# Patient Record
Sex: Male | Born: 1980 | Race: White | Hispanic: No | Marital: Single | Smoking: Never smoker
Health system: Southern US, Community
[De-identification: ages and names within clinical notes are randomized; demographics above are authoritative.]

## PROBLEM LIST (undated history)

## (undated) DIAGNOSIS — G950 Syringomyelia and syringobulbia: Secondary | ICD-10-CM

## (undated) DIAGNOSIS — K219 Gastro-esophageal reflux disease without esophagitis: Secondary | ICD-10-CM

## (undated) DIAGNOSIS — I10 Essential (primary) hypertension: Secondary | ICD-10-CM

## (undated) DIAGNOSIS — Q0700 Arnold-Chiari syndrome without spina bifida or hydrocephalus: Secondary | ICD-10-CM

## (undated) DIAGNOSIS — G2581 Restless legs syndrome: Secondary | ICD-10-CM

## (undated) HISTORY — DX: Syringomyelia and syringobulbia: G95.0

## (undated) HISTORY — PX: BREAST SURGERY: SHX581

## (undated) HISTORY — PX: BRAIN SURGERY: SHX531

## (undated) HISTORY — DX: Essential (primary) hypertension: I10

## (undated) HISTORY — DX: Restless legs syndrome: G25.81

## (undated) HISTORY — DX: Gastro-esophageal reflux disease without esophagitis: K21.9

---

## 2014-11-15 ENCOUNTER — Emergency Department (HOSPITAL_COMMUNITY)
Admission: EM | Admit: 2014-11-15 | Discharge: 2014-11-15 | Disposition: A | Discharge status: Home / Self Care | Payer: Medicare Other | Attending: Emergency Medicine | Admitting: Emergency Medicine

## 2014-11-15 ENCOUNTER — Encounter (HOSPITAL_COMMUNITY): Payer: Self-pay | Admitting: Emergency Medicine

## 2014-11-15 DIAGNOSIS — B029 Zoster without complications: Secondary | ICD-10-CM

## 2014-11-15 DIAGNOSIS — Z79899 Other long term (current) drug therapy: Secondary | ICD-10-CM | POA: Insufficient documentation

## 2014-11-15 DIAGNOSIS — Z87728 Personal history of other specified (corrected) congenital malformations of nervous system and sense organs: Secondary | ICD-10-CM | POA: Diagnosis not present

## 2014-11-15 DIAGNOSIS — R21 Rash and other nonspecific skin eruption: Secondary | ICD-10-CM | POA: Diagnosis present

## 2014-11-15 HISTORY — DX: Arnold-Chiari syndrome without spina bifida or hydrocephalus: Q07.00

## 2014-11-15 MED ORDER — KETOROLAC TROMETHAMINE 30 MG/ML IJ SOLN
30.0000 mg | Freq: Once | INTRAMUSCULAR | Status: AC
  Administered 2014-11-15: 30 mg via INTRAMUSCULAR
  Filled 2014-11-15: qty 1

## 2014-11-15 MED ORDER — VALACYCLOVIR HCL 1 G PO TABS: 1000.0000 mg | ORAL_TABLET | Freq: Three times a day (TID) | ORAL | Status: AC

## 2014-11-15 NOTE — Discharge Instructions (Signed)

## 2014-11-15 NOTE — ED Provider Notes (Signed)
History  This chart was scribed for non-physician practitioner, Eyvonne Mechanic, PA-C,working with Elwin Mocha, MD, by Karle Plumber, ED Scribe. This patient was seen in room TR08C/TR08C and the patient's care was started at 9:46 AM.  Chief Complaint  Patient presents with  . Insect Bite   The history is provided by the patient and medical records. No language interpreter was used.    HPI Comments:  Jacob Ritter is a 34 y.o. male who presents to the Emergency Department complaining of a rash to the left upper arm that began approximately four days ago. He reports the area started bubbling up and began to cause him severe, burning pain. He reports associated redness. He has been applying witch hazel to the area to treat the symptoms with no significant relief. Touching the area or raising his left arm makes the pain worse. He denies alleviating factors. He denies fever, chills, nausea or vomiting. He reports having chicken pox as a child.  Past Medical History  Diagnosis Date  . ACM (Arnold-Chiari malformation)    Past Surgical History  Procedure Laterality Date  . Brain surgery     No family history on file. History  Substance Use Topics  . Smoking status: Never Smoker   . Smokeless tobacco: Not on file  . Alcohol Use: No    Review of Systems  All other systems reviewed and are negative.   Allergies  Seroquel  Home Medications   Prior to Admission medications   Medication Sig Start Date End Date Taking? Authorizing Provider  amLODipine (NORVASC) 10 MG tablet Take 10 mg by mouth daily.   Yes Historical Provider, MD  gabapentin (NEURONTIN) 100 MG capsule Take 1,600 mg by mouth 2 (two) times daily.   Yes Historical Provider, MD  hydrochlorothiazide (HYDRODIURIL) 12.5 MG tablet Take 12.5 mg by mouth daily.   Yes Historical Provider, MD  meloxicam (MOBIC) 15 MG tablet Take 15 mg by mouth daily.   Yes Historical Provider, MD  valACYclovir (VALTREX) 1000 MG tablet Take 1  tablet (1,000 mg total) by mouth 3 (three) times daily. 11/15/14 11/29/14  Eyvonne Mechanic, PA-C   Triage Vitals: BP 146/78 mmHg  Pulse 75  Temp(Src) 97.9 F (36.6 C) (Oral)  Resp 17  Ht  (1.905 m)  Wt 259 lb (117.482 kg)  BMI 32.37 kg/m2  SpO2 98% Physical Exam  Constitutional: He is oriented to person, place, and time. He appears well-developed and well-nourished.  HENT:  Head: Normocephalic and atraumatic.  Eyes: EOM are normal.  Neck: Normal range of motion.  Cardiovascular: Normal rate.   Pulmonary/Chest: Effort normal.  Musculoskeletal: Normal range of motion.  Neurological: He is alert and oriented to person, place, and time.  Skin: Skin is warm and dry. Rash noted. There is erythema.  Multiple erythematous, vesicular  lesions to left posterior shoulder and deltoid region. No signs of surrounding cellulitis.  Psychiatric: He has a normal mood and affect. His behavior is normal.  Nursing note and vitals reviewed.   ED Course  Procedures (including critical care time) DIAGNOSTIC STUDIES: Oxygen Saturation is 98% on RA, normal by my interpretation.   COORDINATION OF CARE: 9:54 AM- Will order Toradol injection for pain. Pt refuses prescription for pain medication. Will prescribe Valtrex. Pt verbalizes understanding and agrees to plan.  Medications  ketorolac (TORADOL) 30 MG/ML injection 30 mg (30 mg Intramuscular Given 11/15/14 1026)    Labs Review Labs Reviewed - No data to display  Imaging Review No results found.  EKG Interpretation None      MDM   Final diagnoses:  Shingles   Labs: n/a  Imaging: n/a  Consults: n/a  Therapeutics: Toradol 30 mg IM  Discharge Meds: Valacyclovir 1 gm PO TID x 7 days  Assessment/Plan: Will give Toradol 30 mg IM injection prior to discharge. Pt declined any pain medication. Will prescribe Valacyclovir 1 gm PO TID x 7 days. Follow-up with primary care provider in 3-5 days for reassessment. Strict return precautions  given, verbalizes understanding and agreement for today's plan.   I personally performed the services described in this documentation, which was scribed in my presence. The recorded information has been reviewed and is accurate.    Eyvonne Mechanic, PA-C 11/15/14 1535  Elwin Mocha, MD 11/15/14 1539

## 2014-11-15 NOTE — ED Notes (Signed)
Pt. Stated, I think I was bitten by a something 4 days ago on my left shoulder and now i can hardly lift it.

## 2014-11-15 NOTE — ED Notes (Signed)
J Hedges, PA, aware pt's HR 50 - no orders received - OK given to d/c pt.

## 2014-11-25 ENCOUNTER — Emergency Department (HOSPITAL_COMMUNITY)
Admission: EM | Admit: 2014-11-25 | Discharge: 2014-11-25 | Disposition: A | Discharge status: Home / Self Care | Payer: Medicare Other | Attending: Emergency Medicine | Admitting: Emergency Medicine

## 2014-11-25 ENCOUNTER — Encounter (HOSPITAL_COMMUNITY): Payer: Self-pay | Admitting: Physical Medicine and Rehabilitation

## 2014-11-25 DIAGNOSIS — L255 Unspecified contact dermatitis due to plants, except food: Secondary | ICD-10-CM | POA: Diagnosis not present

## 2014-11-25 DIAGNOSIS — R531 Weakness: Secondary | ICD-10-CM | POA: Insufficient documentation

## 2014-11-25 DIAGNOSIS — R21 Rash and other nonspecific skin eruption: Secondary | ICD-10-CM | POA: Diagnosis present

## 2014-11-25 DIAGNOSIS — Z79899 Other long term (current) drug therapy: Secondary | ICD-10-CM | POA: Diagnosis not present

## 2014-11-25 MED ORDER — SILVER NITRATE-POT NITRATE 75-25 % EX MISC
1.0000 "application " | Freq: Once | CUTANEOUS | Status: AC
  Administered 2014-11-25: 1 via TOPICAL
  Filled 2014-11-25: qty 1

## 2014-11-25 MED ORDER — PREDNISONE 10 MG PO TABS: 20.0000 mg | ORAL_TABLET | Freq: Every day | ORAL | Status: DC

## 2014-11-25 NOTE — ED Provider Notes (Signed)
CSN: [id number]     Arrival date & time 11/25/14  1133 History  This chart was scribed for non-physician practitioner, Kerrie Buffalo, NP working with Cathren Laine, MD by Gwenyth Ober, ED scribe. This patient was seen in room TR03C/TR03C and the patient's care was started at 1:35 PM   Chief Complaint  Patient presents with  . Herpes Zoster  . Rash   Patient is a 34 y.o. male presenting with rash. The history is provided by the patient. No language interpreter was used.  Rash Location:  Shoulder/arm and foot Shoulder/arm rash location:  L shoulder Foot rash location:  Top of R foot Quality: redness   Quality: not itchy   Severity:  Mild Onset quality:  Gradual Duration:  5 days Timing:  Constant Progression:  Unchanged Chronicity:  New Context: plant contact   Relieved by:  Nothing Worsened by:  Nothing tried Associated symptoms: no fever and no URI     HPI Comments: Jacob Ritter is a 34 y.o. male with a history of Arnold-Chiari malformation, who presents to the Emergency Department complaining of a mild red rash on his right foot, with associated mild throbbing pain, that started 5 days ago. He states a painful sore on the bottom his tongue as an associated symptom. Pt was seen in the ED on 7/30 for a rash on his left shoulder. He was diagnosed with shingles and prescribed Valtrex, which has improved the rash on his shoulder. Pt reports some baseline decreased sensation in his right foot, right leg and left arm which is unchanged today. He endorses walking outside with open-toed shoes and states that someone told him he was walking through poison ivy prior to the onset of symptoms. Pt denies cough, congestion, fever and chills as associated symptoms.  Past Medical History  Diagnosis Date  . ACM (Arnold-Chiari malformation)    Past Surgical History  Procedure Laterality Date  . Brain surgery     History reviewed. No pertinent family history. Social History  Substance Use Topics   . Smoking status: Never Smoker   . Smokeless tobacco: None  . Alcohol Use: No    Review of Systems  Constitutional: Negative for fever and chills.  HENT: Negative for congestion.   Respiratory: Negative for cough.   Skin: Positive for rash.  Neurological: Positive for weakness (that is not new).  All other systems reviewed and are negative.     Allergies  Seroquel  Home Medications   Prior to Admission medications   Medication Sig Start Date End Date Taking? Authorizing Provider  amLODipine (NORVASC) 10 MG tablet Take 10 mg by mouth daily.    Historical Provider, MD  gabapentin (NEURONTIN) 100 MG capsule Take 1,600 mg by mouth 2 (two) times daily.    Historical Provider, MD  hydrochlorothiazide (HYDRODIURIL) 12.5 MG tablet Take 12.5 mg by mouth daily.    Historical Provider, MD  meloxicam (MOBIC) 15 MG tablet Take 15 mg by mouth daily.    Historical Provider, MD  predniSONE (DELTASONE) 10 MG tablet Take 2 tablets (20 mg total) by mouth daily. 11/25/14   Hope Orlene Och, NP  valACYclovir (VALTREX) 1000 MG tablet Take 1 tablet (1,000 mg total) by mouth 3 (three) times daily. 11/15/14 11/29/14  Jeffrey Hedges, PA-C   BP 130/73 mmHg  Pulse 58  Temp(Src) 98.2 F (36.8 C) (Oral)  Resp 14  SpO2 99% Physical Exam  Constitutional: He is oriented to person, place, and time. He appears well-developed and well-nourished. No distress.  HENT:  Head: Normocephalic and atraumatic.  Mouth/Throat: Uvula is midline. No posterior oropharyngeal edema.  Ulcer area on the bottom of the tongue on the right side  Eyes: Conjunctivae and EOM are normal.  Neck: Neck supple. No tracheal deviation present.  Cardiovascular: Normal rate, regular rhythm and normal heart sounds.   Pulses:      Radial pulses are 2+ on the right side, and 2+ on the left side.       Dorsalis pedis pulses are 2+ on the right side, and 2+ on the left side.  Pulmonary/Chest: Effort normal and breath sounds normal. No respiratory  distress.  Abdominal: Soft. Bowel sounds are normal. There is no tenderness.  Musculoskeletal: Normal range of motion.  Neurological: He is alert and oriented to person, place, and time.  With dorsiflexion, he has some weakness on the right which is not new and is related to his congenital brain malformation.   Skin: Skin is warm and dry.  Left arm: Dry, crusting areas to the posterior shoulder and deltoid Dorsum of right foot: Linear, raised red patches  Psychiatric: He has a normal mood and affect. His behavior is normal.  Nursing note and vitals reviewed.   ED Course  Procedures   DIAGNOSTIC STUDIES: Oxygen Saturation is 99% on RA, normal by my interpretation.    COORDINATION OF CARE: 1:47 PM Discussed treatment plan with pt which includes calamine lotion, as needed. Will apply silver nitrate to ulcer on the tongue. Pt agreed to plan.  2:10 PM Ulcer on the tongue cauterized with silver nitrate. Pt tolerated procedure well.   MDM  34 y.o. male with rash and itching to the dorsum of the right foot. Will treat with short course of prednisone. Oral ulcer treated with silver nitrite one time application. Patient to follow up with his PCP this week. Stable for d/c with red streaking from rash on foot or signs of infection.   Final diagnoses:  Contact dermatitis due to plant   I personally performed the services described in this documentation, which was scribed in my presence. The recorded information has been reviewed and is accurate.   [address] [place], NP 11/26/14 2340  Cathren Laine, MD 11/29/14 [phone]

## 2014-11-25 NOTE — Discharge Instructions (Signed)
You may take Benadryl if needed for itching.

## 2014-11-25 NOTE — ED Notes (Signed)
Pt presents to department for evaluation of rash to L foot. States he was recently seen and diagnosed with shingles, placed on Valtrex. No relief from symptoms. Pt is alert and oriented x4.

## 2014-11-25 NOTE — ED Notes (Signed)
Pt recently had shingles and was treated with Valacyclovir. The rash on his left upper arm is healing but has a new outbreak on right foot. Also has a lesion on his tongue. Pt is currently taking the previously prescribed med.

## 2015-07-02 ENCOUNTER — Emergency Department (HOSPITAL_COMMUNITY): Payer: Medicare Other

## 2015-07-02 ENCOUNTER — Encounter (HOSPITAL_COMMUNITY): Payer: Self-pay | Admitting: Nurse Practitioner

## 2015-07-02 ENCOUNTER — Emergency Department (HOSPITAL_COMMUNITY)
Admission: EM | Admit: 2015-07-02 | Discharge: 2015-07-02 | Disposition: A | Discharge status: Home / Self Care | Payer: Medicare Other | Attending: Emergency Medicine | Admitting: Emergency Medicine

## 2015-07-02 DIAGNOSIS — Z79899 Other long term (current) drug therapy: Secondary | ICD-10-CM | POA: Insufficient documentation

## 2015-07-02 DIAGNOSIS — M545 Low back pain, unspecified: Secondary | ICD-10-CM

## 2015-07-02 DIAGNOSIS — Z7952 Long term (current) use of systemic steroids: Secondary | ICD-10-CM | POA: Diagnosis not present

## 2015-07-02 DIAGNOSIS — Q0700 Arnold-Chiari syndrome without spina bifida or hydrocephalus: Secondary | ICD-10-CM | POA: Insufficient documentation

## 2015-07-02 MED ORDER — TIZANIDINE HCL 4 MG PO CAPS: 4.0000 mg | ORAL_CAPSULE | Freq: Three times a day (TID) | ORAL | Status: DC

## 2015-07-02 NOTE — ED Notes (Signed)
He c/o 1 day history lower back pain. He woke with the pain yesterday am and does not remember any strenuous activity that could have caused the pain. History of spinal fractures and surgeries. He took pain medication at home with minimal pain relief. He denies bowel/bladder changes

## 2015-07-02 NOTE — ED Provider Notes (Signed)
CSN: [id number]     Arrival date & time 07/02/15  1158 History  By signing my name below, I, Evon Slack, attest that this documentation has been prepared under the direction and in the presence of Cielo Arias, PA-C. Electronically Signed: Evon Slack, ED Scribe. 07/02/2015. 1:46 PM.    Chief Complaint  Patient presents with  . Back Pain   Patient is a 35 y.o. male presenting with back pain. The history is provided by the patient. No language interpreter was used.  Back Pain  HPI Comments: Jacob Ritter is a 35 y.o. male with PMHx arnold chiari malformation who presents to the Emergency Department complaining of sudden low back pain onset 1 day prior. He states that that the pain began yesterday morning when waking up. Pt state that he had trouble standing up straight due to his back feeling stiff. He describes the pain as aching and is bilateral. The pain does not radiate into the legs or abdomen. Denies alleviating or aggravating factors. Denies recent strenuous activity or trauma. He reports a history of spinal compression fractures in 2013. Pt states that he has been trying hydrocodone and mobic with no relief. Denies fevers, chills, abdominal pain, nausea, vomiting, bowel/bladder incontinence, saddle anesthesia, lower extremity weakness, lower extremity numbness or gait difficulty. He reports multiple brain surgeries in 2013 in [place] related to his Arnold-Chiari malformation. He does not have a neurosurgeon currently.   Past Medical History  Diagnosis Date  . ACM (Arnold-Chiari malformation)    Past Surgical History  Procedure Laterality Date  . Brain surgery    . Breast surgery     History reviewed. No pertinent family history. Social History  Substance Use Topics  . Smoking status: Never Smoker   . Smokeless tobacco: None  . Alcohol Use: No    Review of Systems  Musculoskeletal: Positive for back pain.  All other systems reviewed and are  negative.     Allergies  Seroquel  Home Medications   Prior to Admission medications   Medication Sig Start Date End Date Taking? Authorizing Provider  amLODipine (NORVASC) 10 MG tablet Take 10 mg by mouth daily.    Historical Provider, MD  gabapentin (NEURONTIN) 100 MG capsule Take 1,600 mg by mouth 2 (two) times daily.    Historical Provider, MD  hydrochlorothiazide (HYDRODIURIL) 12.5 MG tablet Take 12.5 mg by mouth daily.    Historical Provider, MD  meloxicam (MOBIC) 15 MG tablet Take 15 mg by mouth daily.    Historical Provider, MD  predniSONE (DELTASONE) 10 MG tablet Take 2 tablets (20 mg total) by mouth daily. 11/25/14   Hope Orlene Och, NP  tiZANidine (ZANAFLEX) 4 MG capsule Take 1 capsule (4 mg total) by mouth 3 (three) times daily. 07/02/15   Yeiden Frenkel, PA-C   BP 132/90 mmHg  Pulse 76  Temp(Src) 98.2 F (36.8 C) (Oral)  Resp 20  SpO2 96%   Physical Exam  Constitutional: He is oriented to person, place, and time. He appears well-developed and well-nourished. No distress.  HENT:  Head: Normocephalic and atraumatic.  Eyes: Conjunctivae and EOM are normal.  Neck: Neck supple. No tracheal deviation present.  Cardiovascular: Normal rate and intact distal pulses.   Pedal pulses palpable.  Pulmonary/Chest: Effort normal. No respiratory distress.  Musculoskeletal: Normal range of motion.       Lumbar back: He exhibits tenderness. He exhibits normal range of motion and no deformity.       Back:  Generalized tenderness over the  lumbar region. There is tenderness over the lumbar spine. No bony deformities or step-offs of the lumbar spine. Full range of motion of the thoracic and lumbar spine intact. During exam, patient accidentally dropped the TV remote and bent over to pick it up without pain. Full range of motion of the bilateral lower extremities intact. Patient ambulates with a steady gait.  Neurological: He is alert and oriented to person, place, and time.  5/5 strength in  the lower extremities on testing of hip flexion, knee flexion/extension and ankle dorsi/plantar flexion. Sensation to light touch intact over the extremities.  Skin: Skin is warm and dry. No rash noted.  No erythema, ecchymosis or other skin changes noted over the lumbar region.  Psychiatric: He has a normal mood and affect. His behavior is normal.  Nursing note and vitals reviewed.   ED Course  Procedures (including critical care time) DIAGNOSTIC STUDIES: Oxygen Saturation is 96% on RA, normal by my interpretation.    COORDINATION OF CARE: 1:46 PM-Discussed treatment plan with pt at bedside and pt agreed to plan.     Labs Review Labs Reviewed - No data to display  Imaging Review Dg Lumbar Spine Complete  07/02/2015  CLINICAL DATA:  Lumbago for 1 day EXAM: LUMBAR SPINE - COMPLETE 4+ VIEW COMPARISON:  None. FINDINGS: Frontal, lateral, spot lumbosacral lateral, and bilateral oblique views were obtained. There are 5 non-rib-bearing lumbar type vertebral bodies. There is mild upper lumbar dextroscoliosis. There is no fracture or spondylolisthesis. The disc spaces appear unremarkable. There is no appreciable facet arthropathy. IMPRESSION: Slight scoliosis. No demonstrable fracture or spondylolisthesis. No appreciable arthropathy. Electronically Signed   By: Bretta BangWilliam  Woodruff III M.D.   On: 07/02/2015 14:43      EKG Interpretation None      MDM   Final diagnoses:  Bilateral low back pain without sciatica   Patient presenting with back pain x 1 day. Afebrile. Non-focal neuro exam. Generalized tenderness over the lumbar region. Patient is able to ambulate though with some discomfort. No loss of bowel or bladder control. No numbness or weakness in the lower extremities. No concern for cauda equina. No history of IVDU or cancer. DG lumbar spine without abnormality. Discussed case with Dr. Donnald GarrePfeiffer who recommends outpatient follow up with neurosurgery due to hx of AVM. Conservative therapy  including back exercises, heat, ice, tylenol or ibuprofen discussed. Will give muscle relaxer for back spasms. Discussed side effects of muscle relaxer and avoiding driving. Return precautions discussed and given in discharge paperwork. Pt is stable for discharge.   I personally performed the services described in this documentation, which was scribed in my presence. The recorded information has been reviewed and is accurate.      Alveta HeimlichStevi Nashalie Sallis, PA-C 07/02/15 1525  Arby BarretteMarcy Pfeiffer, MD 07/03/15 [phone]

## 2015-07-02 NOTE — ED Notes (Signed)
Declined W/C at D/C and was escorted to lobby by RN. 

## 2015-07-02 NOTE — Discharge Instructions (Signed)
Schedule a follow up appointment with neurosurgery, Dr. Jeral FruitBotero. Return to ED with new, worsening or concerning symptoms.    Back Pain, Adult Back pain is very common in adults.The cause of back pain is rarely dangerous and the pain often gets better over time.The cause of your back pain may not be known. Some common causes of back pain include:  Strain of the muscles or ligaments supporting the spine.  Wear and tear (degeneration) of the spinal disks.  Arthritis.  Direct injury to the back. For many people, back pain may return. Since back pain is rarely dangerous, most people can learn to manage this condition on their own. HOME CARE INSTRUCTIONS Watch your back pain for any changes. The following actions may help to lessen any discomfort you are feeling:  Remain active. It is stressful on your back to sit or stand in one place for long periods of time. Do not sit, drive, or stand in one place for more than 30 minutes at a time. Take short walks on even surfaces as soon as you are able.Try to increase the length of time you walk each day.  Exercise regularly as directed by your health care provider. Exercise helps your back heal faster. It also helps avoid future injury by keeping your muscles strong and flexible.  Do not stay in bed.Resting more than 1-2 days can delay your recovery.  Pay attention to your body when you bend and lift. The most comfortable positions are those that put less stress on your recovering back. Always use proper lifting techniques, including:  Bending your knees.  Keeping the load close to your body.  Avoiding twisting.  Find a comfortable position to sleep. Use a firm mattress and lie on your side with your knees slightly bent. If you lie on your back, put a pillow under your knees.  Avoid feeling anxious or stressed.Stress increases muscle tension and can worsen back pain.It is important to recognize when you are anxious or stressed and learn ways  to manage it, such as with exercise.  Take medicines only as directed by your health care provider. Over-the-counter medicines to reduce pain and inflammation are often the most helpful.Your health care provider may prescribe muscle relaxant drugs.These medicines help dull your pain so you can more quickly return to your normal activities and healthy exercise.  Apply ice to the injured area:  Put ice in a plastic bag.  Place a towel between your skin and the bag.  Leave the ice on for 20 minutes, 2-3 times a day for the first 2-3 days. After that, ice and heat may be alternated to reduce pain and spasms.  Maintain a healthy weight. Excess weight puts extra stress on your back and makes it difficult to maintain good posture. SEEK MEDICAL CARE IF:  You have pain that is not relieved with rest or medicine.  You have increasing pain going down into the legs or buttocks.  You have pain that does not improve in one week.  You have night pain.  You lose weight.  You have a fever or chills. SEEK IMMEDIATE MEDICAL CARE IF:   You develop new bowel or bladder control problems.  You have unusual weakness or numbness in your arms or legs.  You develop nausea or vomiting.  You develop abdominal pain.  You feel faint.   This information is not intended to replace advice given to you by your health care provider. Make sure you discuss any questions you have with  your health care provider.   Document Released: 04/04/2005 Document Revised: 04/25/2014 Document Reviewed: 08/06/2013 Elsevier Interactive Patient Education [employer]

## 2017-04-14 IMAGING — DX DG LUMBAR SPINE COMPLETE 4+V
5 series · 5 of 5 positions shown · non-contrast
Comparison: None.

CLINICAL DATA: Lumbago for 1 day

EXAM:
LUMBAR SPINE - COMPLETE 4+ VIEW

[l-spine ap]
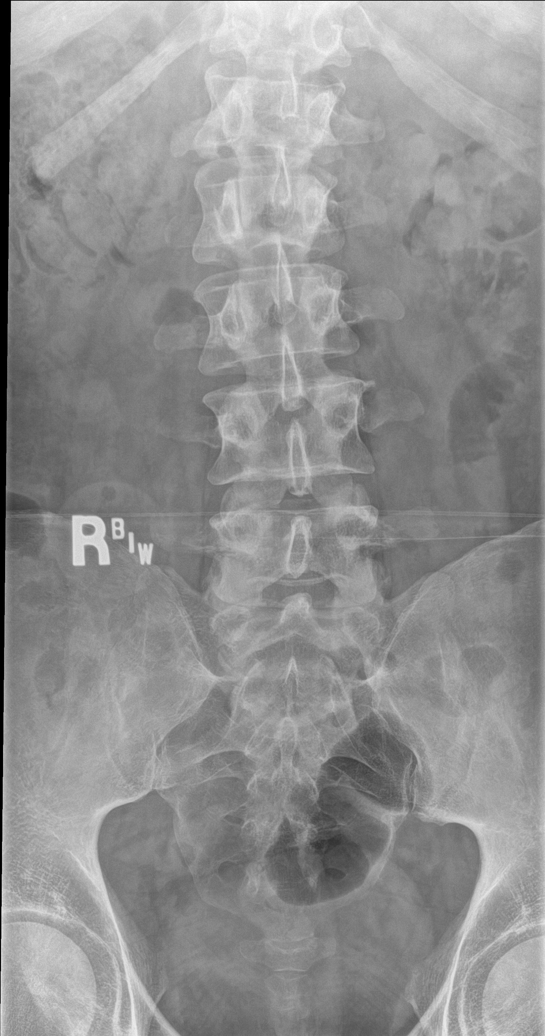

[l-spine obl (1 of 2)]
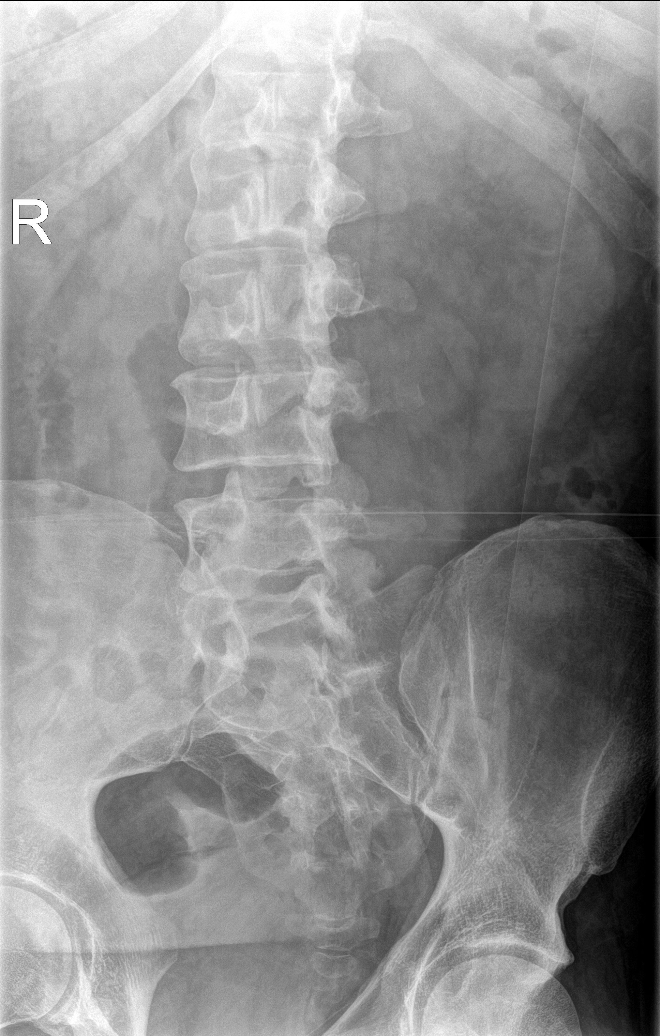

[l-spine obl (2 of 2)]
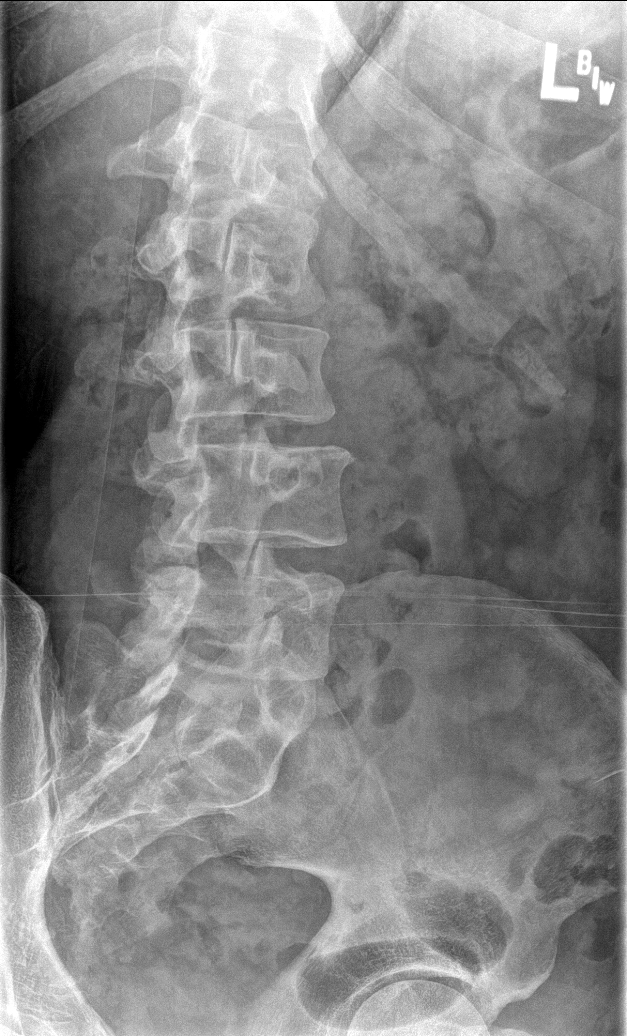

[l-spine lat]
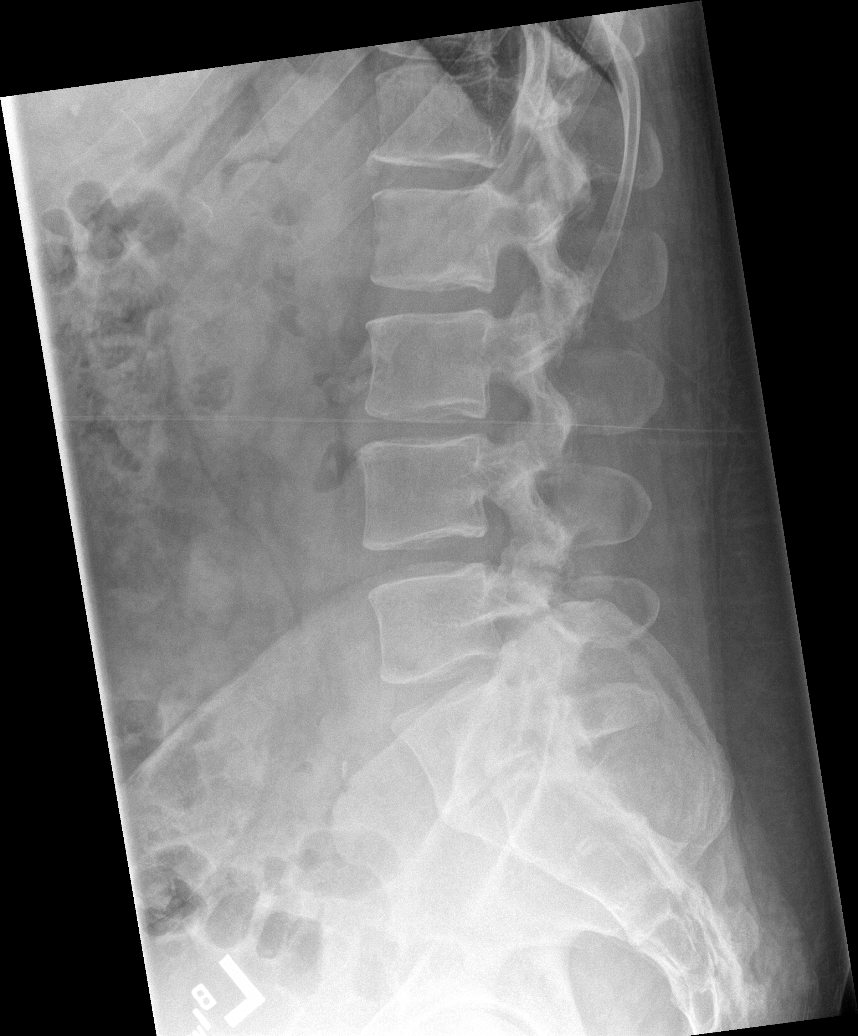

[l-spine spot]
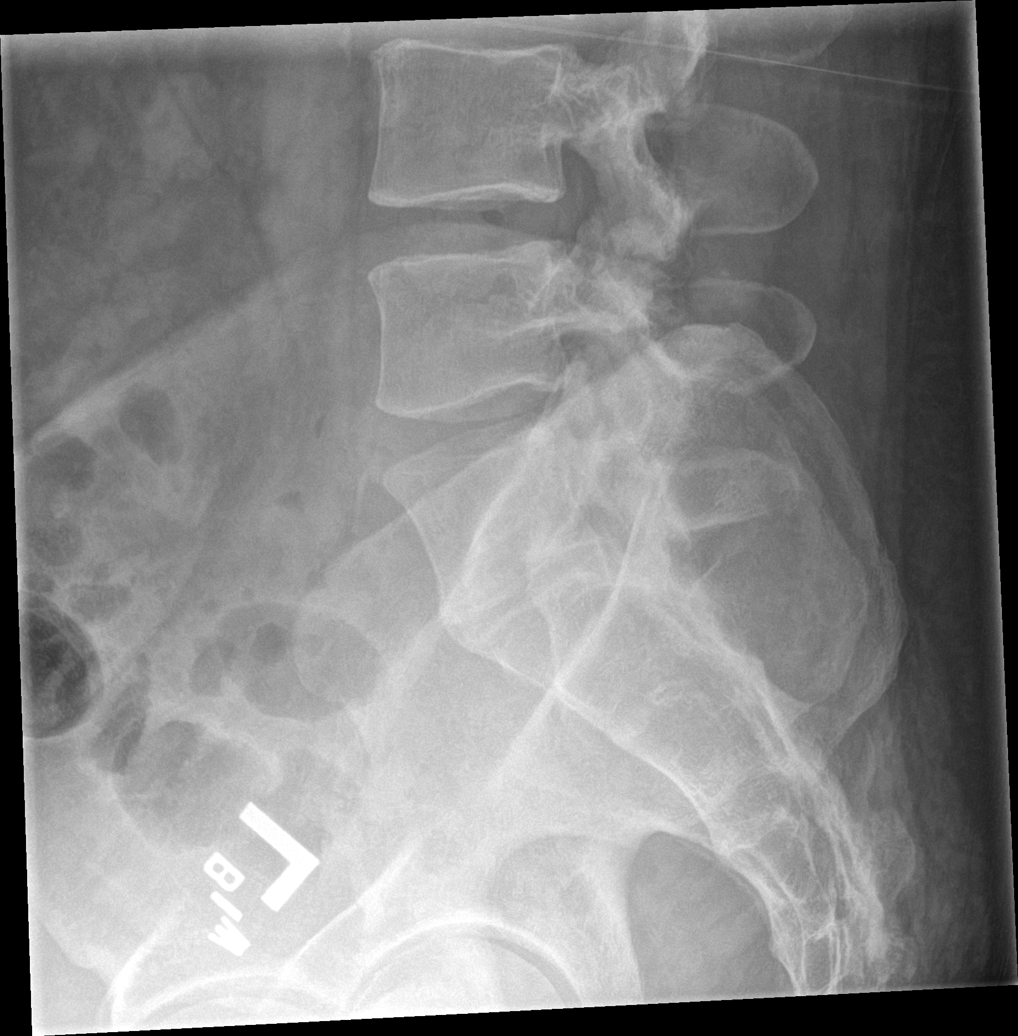

[5 of 5 positions shown; findings below may reference images not displayed]

FINDINGS: Frontal, lateral, spot lumbosacral lateral, and bilateral oblique
views were obtained. There are 5 non-rib-bearing lumbar type
vertebral bodies. There is mild upper lumbar dextroscoliosis. There
is no fracture or spondylolisthesis. The disc spaces appear
unremarkable. There is no appreciable facet arthropathy.
IMPRESSION: Slight scoliosis. No demonstrable fracture or spondylolisthesis. No
appreciable arthropathy.

## 2017-05-01 ENCOUNTER — Other Ambulatory Visit: Payer: Self-pay | Admitting: Family Medicine

## 2017-05-01 DIAGNOSIS — R1031 Right lower quadrant pain: Secondary | ICD-10-CM

## 2017-05-04 ENCOUNTER — Ambulatory Visit
Admission: RE | Admit: 2017-05-04 | Discharge: 2017-05-04 | Disposition: A | Discharge status: Home / Self Care | Payer: Medicare Other | Source: Ambulatory Visit | Attending: Family Medicine | Admitting: Family Medicine

## 2017-05-04 DIAGNOSIS — R1031 Right lower quadrant pain: Secondary | ICD-10-CM

## 2017-06-01 ENCOUNTER — Encounter: Payer: Self-pay | Admitting: *Deleted

## 2017-06-05 ENCOUNTER — Telehealth: Payer: Self-pay | Admitting: *Deleted

## 2017-06-05 ENCOUNTER — Ambulatory Visit: Payer: Medicare Other | Admitting: Diagnostic Neuroimaging

## 2017-06-05 ENCOUNTER — Encounter: Payer: Self-pay | Admitting: Diagnostic Neuroimaging

## 2017-06-05 NOTE — Telephone Encounter (Signed)
Patient was no show for new patient appointment today. 

## 2023-06-29 ENCOUNTER — Encounter (HOSPITAL_COMMUNITY): Payer: Self-pay

## 2023-06-29 ENCOUNTER — Emergency Department (HOSPITAL_COMMUNITY)
Admission: EM | Admit: 2023-06-29 | Discharge: 2023-06-30 | Disposition: A | Discharge status: Home / Self Care | Attending: Emergency Medicine | Admitting: Emergency Medicine

## 2023-06-29 ENCOUNTER — Other Ambulatory Visit: Payer: Self-pay

## 2023-06-29 DIAGNOSIS — R1031 Right lower quadrant pain: Secondary | ICD-10-CM | POA: Diagnosis present

## 2023-06-29 LAB — URINALYSIS, ROUTINE W REFLEX MICROSCOPIC
Bilirubin Urine: NEGATIVE
Glucose, UA: NEGATIVE mg/dL
Hgb urine dipstick: NEGATIVE
Ketones, ur: NEGATIVE mg/dL
Leukocytes,Ua: NEGATIVE
Nitrite: NEGATIVE
Protein, ur: NEGATIVE mg/dL
Specific Gravity, Urine: 1.027 (ref 1.005–1.030)
pH: 5 (ref 5.0–8.0)

## 2023-06-29 LAB — CBC
HCT: 45.9 % (ref 39.0–52.0)
Hemoglobin: 16.1 g/dL (ref 13.0–17.0)
MCH: 29.7 pg (ref 26.0–34.0)
MCHC: 35.1 g/dL (ref 30.0–36.0)
MCV: 84.7 fL (ref 80.0–100.0)
Platelets: 237 10*3/uL (ref 150–400)
RBC: 5.42 MIL/uL (ref 4.22–5.81)
RDW: 12.8 % (ref 11.5–15.5)
WBC: 9.5 10*3/uL (ref 4.0–10.5)
nRBC: 0 % (ref 0.0–0.2)

## 2023-06-29 NOTE — ED Triage Notes (Signed)
 Pt reports pinching and stabbing pain to right groin and RLQ abdominal pain. He had hernia surgery 2 years ago and the pain has never gone away but over the last few days the pain has become worse. He saw his PCP for this on Monday and did a full workup and he was told everything came back fine.

## 2023-06-30 LAB — COMPREHENSIVE METABOLIC PANEL
ALT: 20 U/L (ref 0–44)
AST: 17 U/L (ref 15–41)
Albumin: 4.3 g/dL (ref 3.5–5.0)
Alkaline Phosphatase: 66 U/L (ref 38–126)
Anion gap: 10 (ref 5–15)
BUN: 20 mg/dL (ref 6–20)
CO2: 27 mmol/L (ref 22–32)
Calcium: 9.4 mg/dL (ref 8.9–10.3)
Chloride: 105 mmol/L (ref 98–111)
Creatinine, Ser: 1.07 mg/dL (ref 0.61–1.24)
GFR, Estimated: >60 mL/min (ref >60)
Glucose, Bld: 127 mg/dL — ABNORMAL HIGH (ref 70–99)
Potassium: 4.3 mmol/L (ref 3.5–5.1)
Sodium: 142 mmol/L (ref 135–145)
Total Bilirubin: 0.8 mg/dL (ref 0.0–1.2)
Total Protein: 7.2 g/dL (ref 6.5–8.1)

## 2023-06-30 LAB — LIPASE, BLOOD: Lipase: 29 U/L (ref 11–51)

## 2023-06-30 NOTE — ED Notes (Signed)
 AVS was reviewed with pt and s/o at bedside. Pt verbalized understanding and denies any additional questions at this time.

## 2023-06-30 NOTE — Discharge Instructions (Signed)

## 2023-06-30 NOTE — ED Provider Notes (Signed)
 [place] [facility] AT [facility] Provider Note   CSN: [id number] Arrival date & time: 06/29/23  2141     History  Chief Complaint  Patient presents with   Abdominal Pain    Jacob Ritter is a 43 y.o. male.  The history is provided by the patient and a significant other.   Patient presents for right inguinal pain.  Patient reports he had right inguinal repair over 2 years ago.  He was told that time that he may have chronic pain in that area.  He reports he has always had some discomfort in the region since that time.  However around 2 weeks ago he started having increasing pain which appeared  to coincide with lifting heavy electronic equipment No recent falls or trauma No fevers or vomiting.  He does report intermittent chronic constipation.  No dysuria.  No testicular swelling or pain Recently seen by PCP and told he was healthy. Has follow-up with his surgeon next week    Home Medications Prior to Admission medications   Medication Sig Start Date End Date Taking? Authorizing Provider  amLODipine (NORVASC) 10 MG tablet Take 10 mg by mouth daily.    [provider]  gabapentin (NEURONTIN) 100 MG capsule Take 1,600 mg by mouth 2 (two) times daily.    [provider]  hydrochlorothiazide (HYDRODIURIL) 12.5 MG tablet Take 12.5 mg by mouth daily.    [provider]      Allergies    Seroquel [quetiapine fumarate]    Review of Systems   Review of Systems  Constitutional:  Negative for fever.  Gastrointestinal:  Negative for vomiting.  Genitourinary:  Negative for dysuria, flank pain, penile pain and testicular pain.    Physical Exam Updated Vital Signs BP (!) 147/79   Pulse 67   Temp (!) 97.4 F (36.3 C) (Temporal)   Resp 12   Ht 1.905 m (6\' 3" )   Wt 117.5 kg   SpO2 100%   BMI 32.37 kg/m  Physical Exam CONSTITUTIONAL: Well developed/well nourished HEAD: Normocephalic/atraumatic ENMT: Mucous membranes  moist NECK: supple no meningeal signs CV: S1/S2 noted, no murmurs/rubs/gallops noted LUNGS: Lungs are clear to auscultation bilaterally, no apparent distress ABDOMEN: soft, nontender, no rebound or guarding, bowel sounds noted throughout abdomen GU:no cva tenderness Testicles descended bilaterally.  There is no tenderness or overlying erythema to the scrotum  There is no inguinal hernia noted.  Mild tenderness noted to the right inguinal region.  No overlying erythema, no pulsatile thrill or lymphadenopathy. No erythema or tenderness to perineum Nurse Fredric Mare present for exam NEURO: Pt is awake/alert/appropriate, moves all extremitiesx4.  No facial droop.   EXTREMITIES: pulses normal/equal in both feet, full ROM SKIN: warm, color normal PSYCH: no abnormalities of mood noted, alert and oriented to situation  ED Results / Procedures / Treatments   Labs (all labs ordered are listed, but only abnormal results are displayed) Labs Reviewed  COMPREHENSIVE METABOLIC PANEL - Abnormal; Notable for the following components:      Result Value   Glucose, Bld 127 (*)    All other components within normal limits  LIPASE, BLOOD  CBC  URINALYSIS, ROUTINE W REFLEX MICROSCOPIC    EKG None  Radiology No results found.  Procedures Procedures    Medications Ordered in ED Medications - No data to display  ED Course/ Medical Decision Making/ A&P  Medical Decision Making Amount and/or Complexity of Data Reviewed Labs: ordered.   This patient presents to the ED for concern of inguinal pain, this involves an extensive number of treatment options, and is a complaint that carries with it a high risk of complications and morbidity.  The differential diagnosis includes but is not limited to muscle strain, hernia, testicular torsion, orchitis, epididymitis, appendicitis  Comorbidities that complicate the patient evaluation: Patient's presentation is complicated by  their history of previous hernia surgery  Social Determinants of Health: Patient's  anxiety   increases the complexity of managing their presentation  Additional history obtained: Additional history obtained from significant other Records reviewed Care Everywhere/External Records  Lab Tests: I Ordered, and personally interpreted labs.  The pertinent results include: Labs overall reassuring  Test Considered: CT imaging considered, but since patient is had pain ongoing for years, will defer at this time   Reevaluation: After the interventions noted above, I reevaluated the patient and found that they have :improved  Complexity of problems addressed: Patient's presentation is most consistent with  acute complicated illness/injury requiring diagnostic workup  Disposition: After consideration of the diagnostic results and the patient's response to treatment,  I feel that the patent would benefit from discharge   .   No indication for imaging at this time.  Patient is well-appearing.  He already has follow-up with surgery.  Advised limiting heavy lifting, and increasing fiber and stool softeners to help with constipation that is chronic         Final Clinical Impression(s) / ED Diagnoses Final diagnoses:  Right inguinal pain    Rx / DC Orders ED Discharge Orders     None         Zadie Rhine, MD 06/30/23 [phone]
